# Patient Record
Sex: Male | Born: 1945 | Race: Black or African American | Hispanic: No | Marital: Married | Smoking: Current every day smoker
Health system: Southern US, Community
[De-identification: ages and names within clinical notes are randomized; demographics above are authoritative.]

## PROBLEM LIST (undated history)

## (undated) DIAGNOSIS — E7800 Pure hypercholesterolemia, unspecified: Secondary | ICD-10-CM

## (undated) DIAGNOSIS — I10 Essential (primary) hypertension: Secondary | ICD-10-CM

---

## 2017-03-15 ENCOUNTER — Emergency Department (HOSPITAL_COMMUNITY)
Admission: EM | Admit: 2017-03-15 | Discharge: 2017-03-15 | Disposition: A | Discharge status: Home / Self Care | Payer: Medicare Other | Attending: Emergency Medicine | Admitting: Emergency Medicine

## 2017-03-15 ENCOUNTER — Encounter (HOSPITAL_COMMUNITY): Payer: Self-pay

## 2017-03-15 ENCOUNTER — Other Ambulatory Visit: Payer: Self-pay

## 2017-03-15 DIAGNOSIS — I10 Essential (primary) hypertension: Secondary | ICD-10-CM | POA: Diagnosis present

## 2017-03-15 DIAGNOSIS — F17210 Nicotine dependence, cigarettes, uncomplicated: Secondary | ICD-10-CM | POA: Diagnosis not present

## 2017-03-15 DIAGNOSIS — E785 Hyperlipidemia, unspecified: Secondary | ICD-10-CM | POA: Diagnosis not present

## 2017-03-15 DIAGNOSIS — Z760 Encounter for issue of repeat prescription: Secondary | ICD-10-CM | POA: Insufficient documentation

## 2017-03-15 HISTORY — DX: Essential (primary) hypertension: I10

## 2017-03-15 HISTORY — DX: Pure hypercholesterolemia, unspecified: E78.00

## 2017-03-15 MED ORDER — AMLODIPINE BESYLATE 5 MG PO TABS
10.0000 mg | ORAL_TABLET | Freq: Once | ORAL | Status: AC
  Administered 2017-03-15: 10 mg via ORAL
  Filled 2017-03-15: qty 2

## 2017-03-15 MED ORDER — LISINOPRIL 20 MG PO TABS
20.0000 mg | ORAL_TABLET | Freq: Once | ORAL | Status: AC
  Administered 2017-03-15: 20 mg via ORAL
  Filled 2017-03-15: qty 1

## 2017-03-15 MED ORDER — AMLODIPINE BESYLATE 10 MG PO TABS: 10.0000 mg | ORAL_TABLET | Freq: Every day | ORAL | 0 refills | Status: AC

## 2017-03-15 MED ORDER — LISINOPRIL 20 MG PO TABS: 20.0000 mg | ORAL_TABLET | Freq: Every day | ORAL | 0 refills | Status: AC

## 2017-03-15 NOTE — ED Triage Notes (Signed)
Patient complains of not having BP meds x 5 days, today felt a little light headed while shopping and states that he knows related to not having meds. Alert and oriented, denies pain, NAD. No neuro deficits

## 2017-03-15 NOTE — ED Provider Notes (Signed)
[facility] EMERGENCY DEPARTMENT Provider Note   CSN: [id number] Arrival date & time: 03/15/17  [id number]     History   Chief Complaint No chief complaint on file.   HPI Nathan Wise is a 71 y.o. male.  HPI   71 year old male presents today for medication refill.  He notes a significant past medical history of hyperlipidemia and hypertension.  Patient notes he used to live in [place] and was seen at the [facility].  His last visit was approximately 2 months ago, he was taking lisinopril and amlodipine at that time with effective hypertensive management.  Patient notes after moving here he has not establish care at the [facility].  He notes over the last 5 days he has not had his antihypertensive medication.  Patient notes that he "feels off", but denies any chest pain, shortness of breath, dizziness or lightheadedness, abdominal pain, or any neurological deficits.  No other signs of acute endorgan damage.  He denies any side effects from taking the medications.   Past Medical History:  Diagnosis Date  . High cholesterol   . Hypertension     There are no active problems to display for this patient.   History reviewed. No pertinent surgical history.     Home Medications    Prior to Admission medications   Medication Sig Start Date End Date Taking? Authorizing Provider  amLODipine (NORVASC) 10 MG tablet Take 1 tablet (10 mg total) by mouth daily. 03/15/17   Angie Piercey, Tinnie GensJeffrey, PA-C  lisinopril (PRINIVIL,ZESTRIL) 20 MG tablet Take 1 tablet (20 mg total) by mouth daily. 03/15/17   Eyvonne MechanicHedges, Aleksandar Duve, PA-C    Family History No family history on file.  Social History Social History   Tobacco Use  . Smoking status: Current Every Day Smoker  . Smokeless tobacco: Never Used  Substance Use Topics  . Alcohol use: Not on file  . Drug use: Not on file     Allergies   Patient has no known allergies.   Review of Systems Review of Systems  All other systems reviewed and  are negative.  Physical Exam Updated Vital Signs BP (!) 194/107   Pulse (!) 58   Temp 98 F (36.7 C) (Oral)   Resp 18   SpO2 99%   Physical Exam  Constitutional: He is oriented to person, place, and time. He appears well-developed and well-nourished.  HENT:  Head: Normocephalic and atraumatic.  Eyes: Conjunctivae are normal. Pupils are equal, round, and reactive to light. Right eye exhibits no discharge. Left eye exhibits no discharge. No scleral icterus.  Neck: Normal range of motion. No JVD present. No tracheal deviation present.  Cardiovascular: Normal rate, regular rhythm, normal heart sounds and intact distal pulses.  No murmur heard. Pulmonary/Chest: Effort normal and breath sounds normal. No stridor. No respiratory distress. He has no wheezes. He has no rales. He exhibits no tenderness.  Neurological: He is alert and oriented to person, place, and time. Coordination normal.  Psychiatric: He has a normal mood and affect. His behavior is normal. Judgment and thought content normal.  Nursing note and vitals reviewed.    ED Treatments / Results  Labs (all labs ordered are listed, but only abnormal results are displayed) Labs Reviewed - No data to display  EKG  EKG Interpretation None       Radiology No results found.  Procedures Procedures (including critical care time)  Medications Ordered in ED Medications  lisinopril (PRINIVIL,ZESTRIL) tablet 20 mg (20 mg Oral Given  03/15/17 0925)  amLODipine (NORVASC) tablet 10 mg (10 mg Oral Given 03/15/17 0925)     Initial Impression / Assessment and Plan / ED Course  I have reviewed the triage vital signs and the nursing notes.  Pertinent labs & imaging results that were available during my care of the patient were reviewed by me and considered in my medical decision making (see chart for details).     Final Clinical Impressions(s) / ED Diagnoses   Final diagnoses:  Hypertension, unspecified type   71 year old  male presents today with complaints of hypertension.  He has no signs of endorgan damage.  Nursing notes lightheadedness, patient denies this he denies any other acute signs or symptoms here today.  Patient will receive a dose of his blood pressure medication, he will continue taking medications at home follow-up closely at the [facility], strict return precautions given.  Patient verbalized understanding and this plan.   ED Discharge Orders        Ordered    amLODipine (NORVASC) 10 MG tablet  Daily     03/15/17 0906    lisinopril (PRINIVIL,ZESTRIL) 20 MG tablet  Daily     03/15/17 0906       Eyvonne MechanicHedges, Kyrah Schiro, PA-C 03/15/17 [id number]    Raeford RazorKohut, Stephen, MD 03/15/17 [phone]

## 2017-03-15 NOTE — ED Notes (Signed)
Pt verbalized understanding of d/c instructions and has no further questions. Pt hypertensive BP 190 systolic, PA notified and pt is okay to go home. Pt provided with BP prescriptions. Removed all belongings.

## 2017-03-15 NOTE — Discharge Instructions (Signed)
Please read attached information. If you experience any new or worsening signs or symptoms please return to the emergency room for evaluation. Please follow-up with your primary care provider or specialist as discussed. Please use medication prescribed only as directed and discontinue taking if you have any concerning signs or symptoms.   °

## 2017-05-08 ENCOUNTER — Other Ambulatory Visit: Payer: Self-pay | Admitting: Nephrology

## 2017-05-08 DIAGNOSIS — N184 Chronic kidney disease, stage 4 (severe): Secondary | ICD-10-CM

## 2017-05-13 ENCOUNTER — Ambulatory Visit
Admission: RE | Admit: 2017-05-13 | Discharge: 2017-05-13 | Disposition: A | Discharge status: Home / Self Care | Payer: Medicare Other | Source: Ambulatory Visit | Attending: Nephrology | Admitting: Nephrology

## 2017-05-13 ENCOUNTER — Other Ambulatory Visit: Payer: Medicare Other

## 2017-05-13 DIAGNOSIS — N184 Chronic kidney disease, stage 4 (severe): Secondary | ICD-10-CM

## 2019-10-03 IMAGING — US US RENAL
1 series · 14 of 25 positions shown · non-contrast
Comparison: None.

CLINICAL DATA: Chronic kidney disease, stage IV.

EXAM:
RENAL / URINARY TRACT ULTRASOUND COMPLETE

[Series 1: us renal · 0.23mm/px · 14 of 62 slices shown]
[im 1/62]
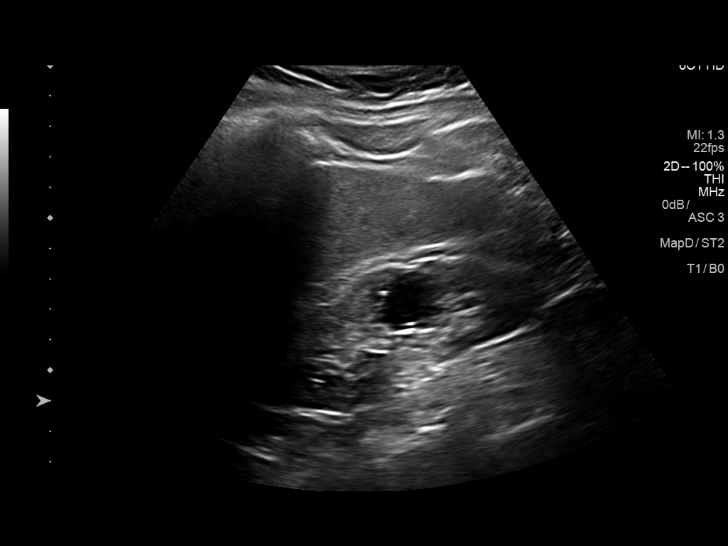
[im 6/62]
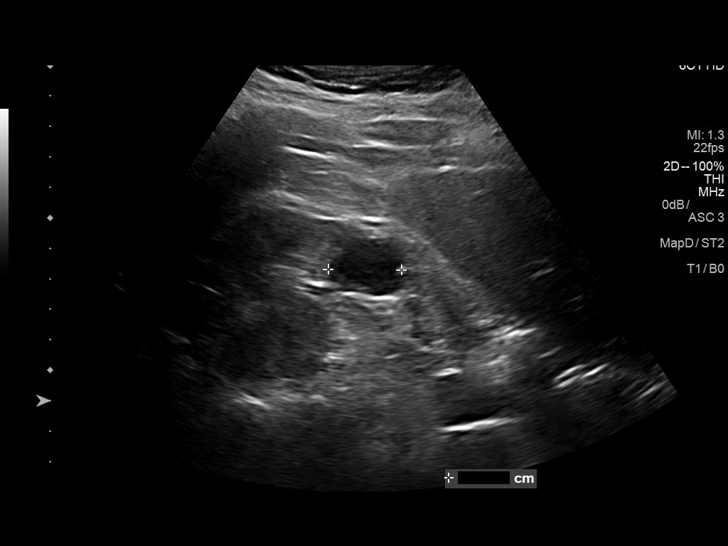
[im 11/62]
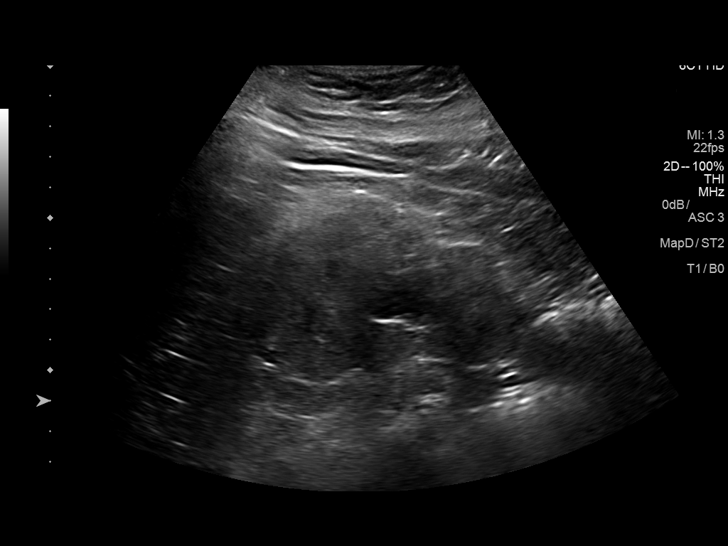
[im 16/62]
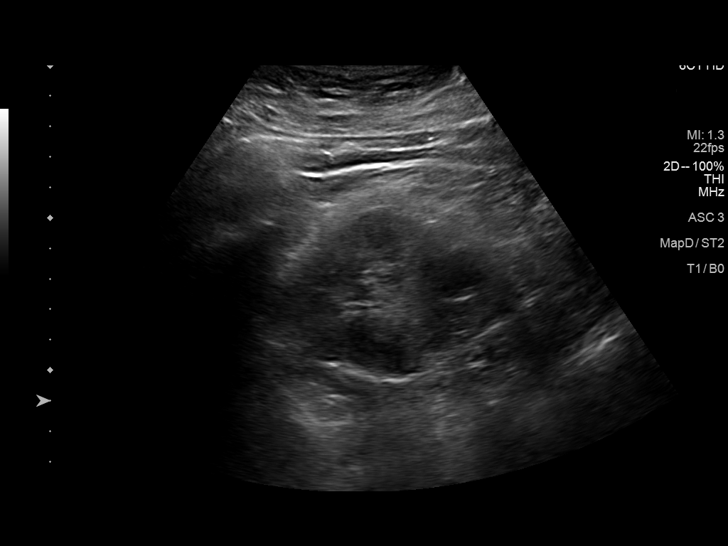
[im 21/62]
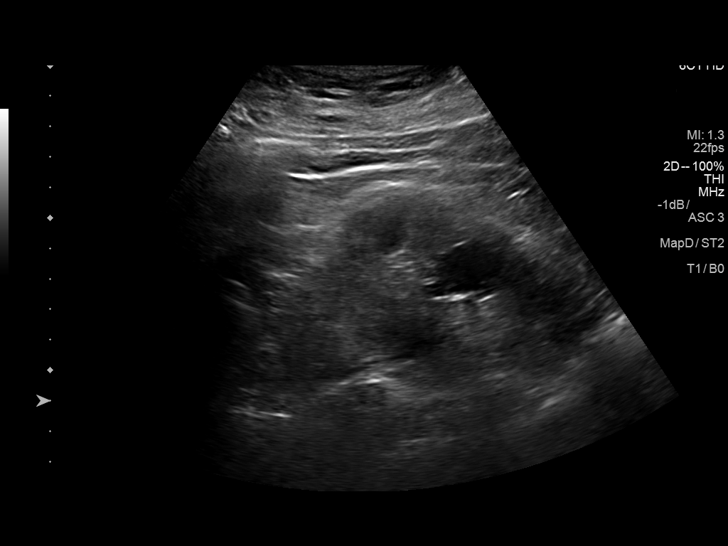
[im 23/62]
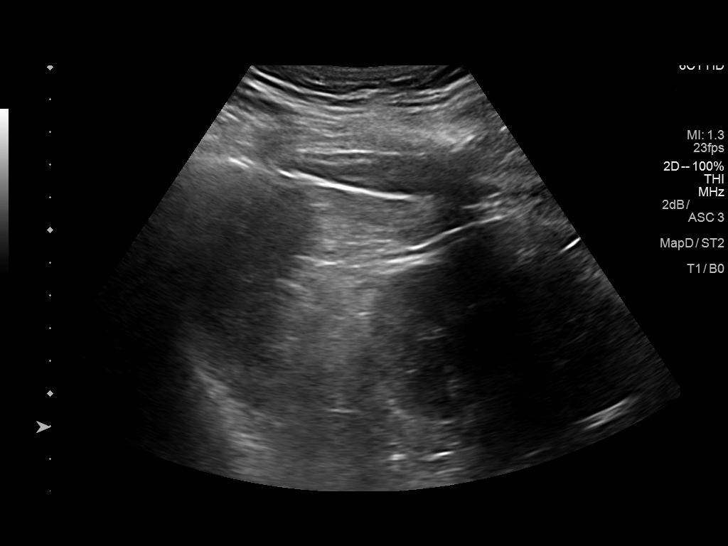
[im 28/62]
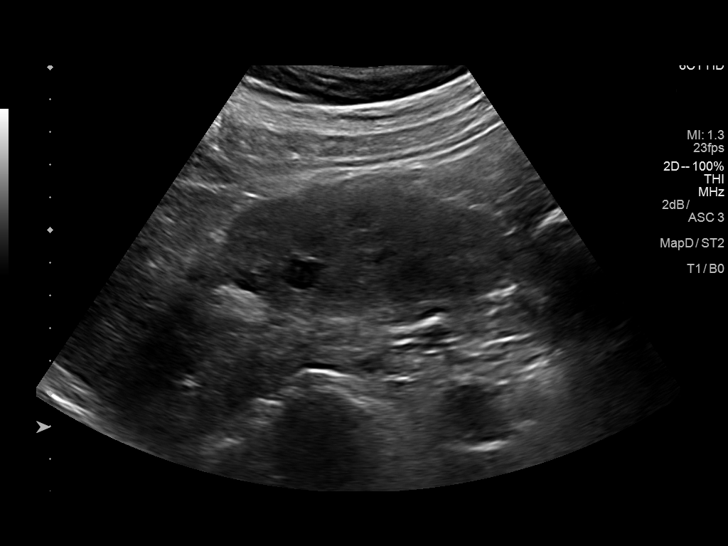
[im 34/62]
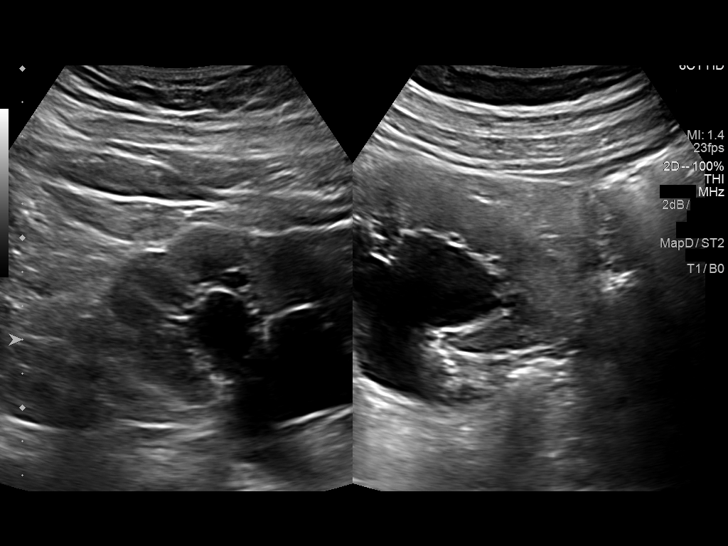
[im 39/62]
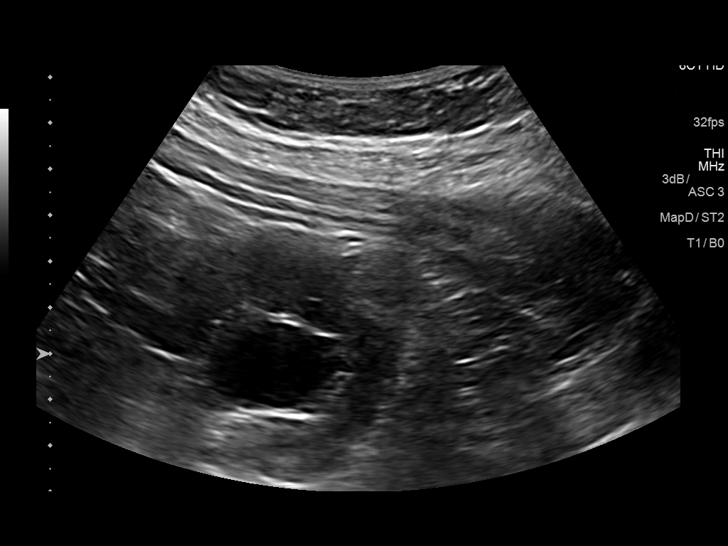
[im 41/62]
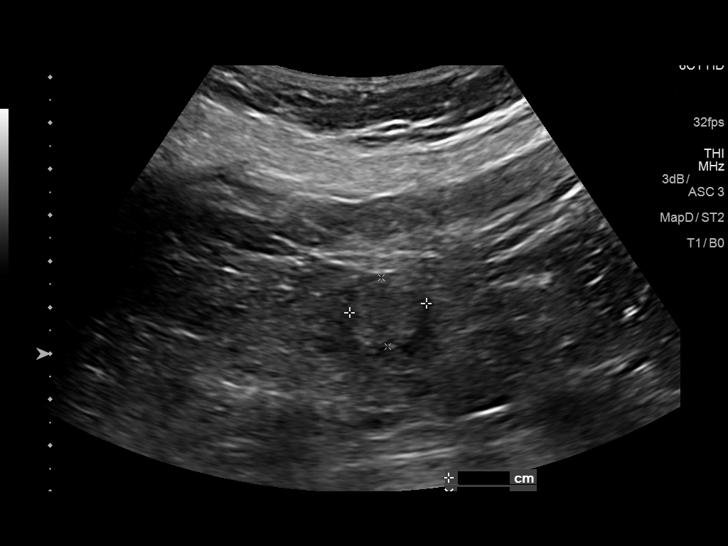
[im 46/62]
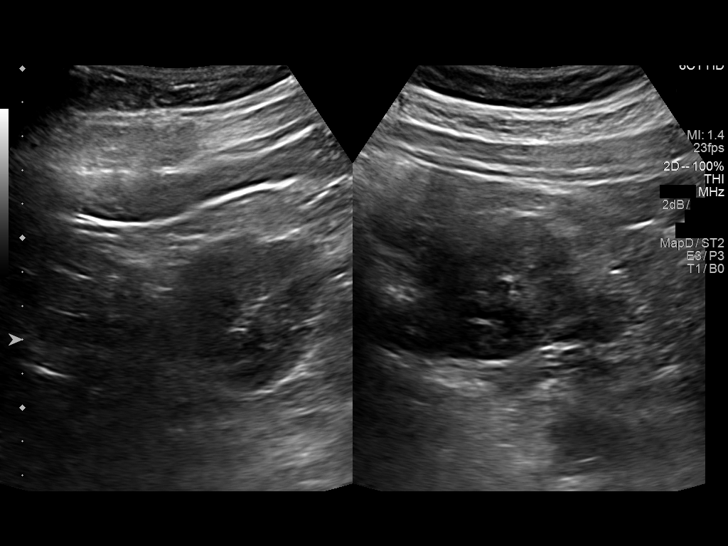
[im 51/62]
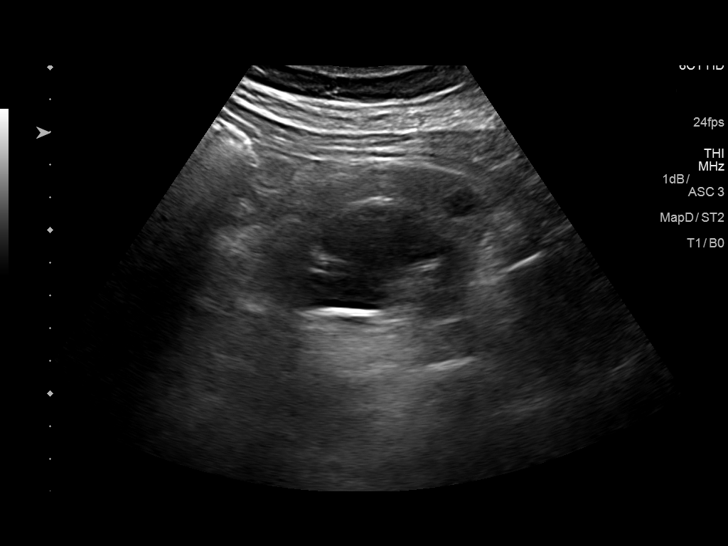
[im 56/62]
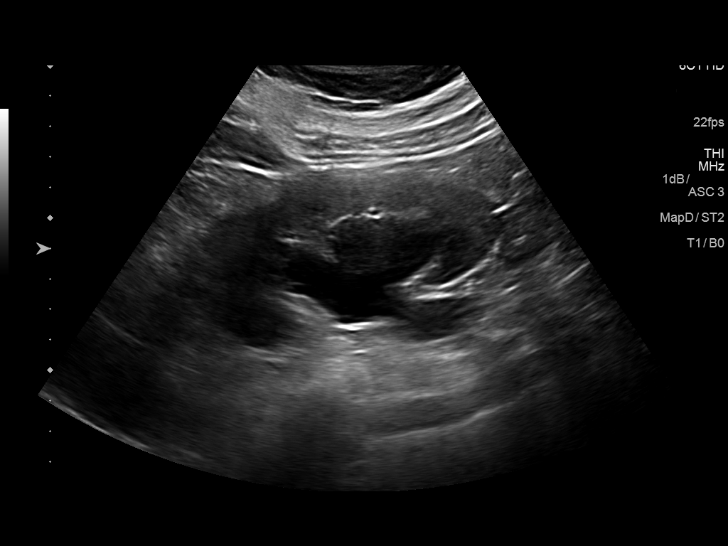
[im 62/62]
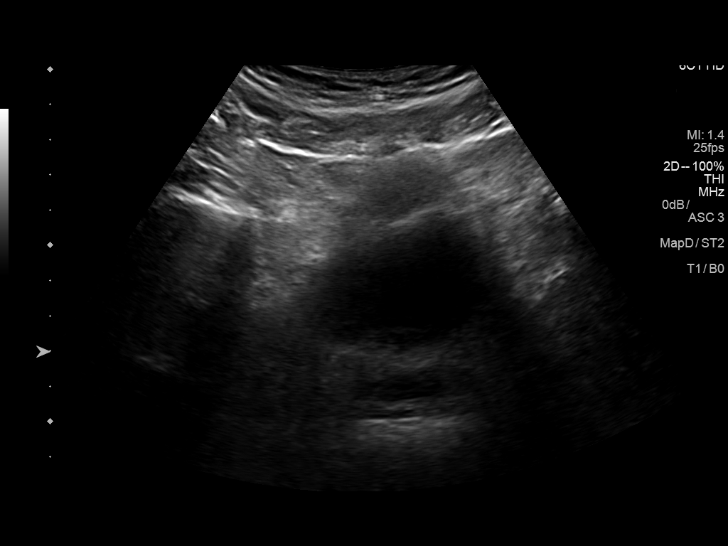

[14 of 25 positions shown; findings below may reference images not displayed]

FINDINGS: Right Kidney:

Length: 10.8 cm. Mildly increased cortical echogenicity. Lower pole
cyst measures 2.5 cm in greatest diameter and has the appearance of
a benign cyst. No hydronephrosis.

Left Kidney:

Length: 10.2 cm. There is evidence of severe left-sided
hydronephrosis. This extends into the renal pelvis. It is difficult
to determine if the proximal ureter is dilated. No obvious
obstructing mass or calculus identified by ultrasound. Subtle
exophytic rounded area of the upper pole cortex measures
approximately 1.8 cm in greatest diameter. A subtle upper pole mass
is not excluded. There is a small 1.3 cm lower pole simple cyst.

Bladder:

Appears normal for degree of bladder distention.
IMPRESSION: 1. Severe left-sided hydronephrosis. Etiology is not obvious by
ultrasound. There may be a subtle 1.8 cm solid mass of the upper
pole of the left kidney.
2. Mildly echogenic right kidney without evidence of hydronephrosis.

## 2020-06-20 DEATH — deceased
